# Patient Record
Sex: Female | Born: 2000 | Race: Black or African American | Hispanic: No | Marital: Single | Smoking: Never smoker
Health system: Southern US, Community
[De-identification: ages and names within clinical notes are randomized; demographics above are authoritative.]

## PROBLEM LIST (undated history)

## (undated) DIAGNOSIS — J45909 Unspecified asthma, uncomplicated: Secondary | ICD-10-CM

---
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
## 2021-09-18 DIAGNOSIS — F419 Anxiety disorder, unspecified: Secondary | ICD-10-CM | POA: Diagnosis not present

## 2021-09-18 DIAGNOSIS — E559 Vitamin D deficiency, unspecified: Secondary | ICD-10-CM | POA: Diagnosis not present

## 2021-09-18 DIAGNOSIS — Z6821 Body mass index (BMI) 21.0-21.9, adult: Secondary | ICD-10-CM | POA: Diagnosis not present

## 2021-10-10 DIAGNOSIS — Z1331 Encounter for screening for depression: Secondary | ICD-10-CM | POA: Diagnosis not present

## 2021-10-10 DIAGNOSIS — Z0001 Encounter for general adult medical examination with abnormal findings: Secondary | ICD-10-CM | POA: Diagnosis not present

## 2021-10-10 DIAGNOSIS — Z6821 Body mass index (BMI) 21.0-21.9, adult: Secondary | ICD-10-CM | POA: Diagnosis not present

## 2021-10-10 DIAGNOSIS — F419 Anxiety disorder, unspecified: Secondary | ICD-10-CM | POA: Diagnosis not present

## 2022-03-19 DIAGNOSIS — Z6822 Body mass index (BMI) 22.0-22.9, adult: Secondary | ICD-10-CM | POA: Diagnosis not present

## 2022-03-19 DIAGNOSIS — F419 Anxiety disorder, unspecified: Secondary | ICD-10-CM | POA: Diagnosis not present

## 2022-03-19 DIAGNOSIS — R053 Chronic cough: Secondary | ICD-10-CM | POA: Diagnosis not present

## 2023-04-23 DIAGNOSIS — Z6829 Body mass index (BMI) 29.0-29.9, adult: Secondary | ICD-10-CM | POA: Diagnosis not present

## 2023-04-23 DIAGNOSIS — E663 Overweight: Secondary | ICD-10-CM | POA: Diagnosis not present

## 2023-04-23 DIAGNOSIS — F419 Anxiety disorder, unspecified: Secondary | ICD-10-CM | POA: Diagnosis not present

## 2023-05-13 DIAGNOSIS — Z6828 Body mass index (BMI) 28.0-28.9, adult: Secondary | ICD-10-CM | POA: Diagnosis not present

## 2023-05-13 DIAGNOSIS — E559 Vitamin D deficiency, unspecified: Secondary | ICD-10-CM | POA: Diagnosis not present

## 2023-05-13 DIAGNOSIS — E663 Overweight: Secondary | ICD-10-CM | POA: Diagnosis not present

## 2023-05-13 DIAGNOSIS — Z0001 Encounter for general adult medical examination with abnormal findings: Secondary | ICD-10-CM | POA: Diagnosis not present

## 2023-05-13 DIAGNOSIS — Z1331 Encounter for screening for depression: Secondary | ICD-10-CM | POA: Diagnosis not present

## 2023-09-03 DIAGNOSIS — Z0001 Encounter for general adult medical examination with abnormal findings: Secondary | ICD-10-CM | POA: Diagnosis not present

## 2023-09-03 DIAGNOSIS — E559 Vitamin D deficiency, unspecified: Secondary | ICD-10-CM | POA: Diagnosis not present
